# Patient Record
Sex: Female | Born: 2000 | Hispanic: Yes | Marital: Single
Health system: Midwestern US, Academic
[De-identification: ages and names within clinical notes are randomized; demographics above are authoritative.]

---

## 2022-02-16 ENCOUNTER — Encounter: Admit: 2022-02-16 | Discharge: 2022-02-16 | Payer: BC Managed Care – PPO

## 2022-02-16 ENCOUNTER — Ambulatory Visit: Admit: 2022-02-16 | Discharge: 2022-02-17 | Payer: BC Managed Care – PPO

## 2022-02-16 DIAGNOSIS — A749 Chlamydial infection, unspecified: Secondary | ICD-10-CM

## 2022-02-16 DIAGNOSIS — Z3402 Encounter for supervision of normal first pregnancy, second trimester: Secondary | ICD-10-CM

## 2022-02-16 NOTE — Progress Notes
Records request for all prenatal records faxed to [facility] at [phone]. Confirmation of successful transmission received

## 2022-02-16 NOTE — Telephone Encounter
Patient is 21 y.o. G1P0 [redacted]w[redacted]d by No LMP recorded. Patient is pregnant.  Pregnancy confirmed by ultrasound. Alison Miles     IPV date: 03/16/22  GA at IPV: 22wk6d  Request for earlier appointment: yes placed on high priority waiting list. Patient does desire CNM care but this is first available IPV with any provider.     21 y.o. G1P0 at [redacted]w[redacted]d gestational age not established patient with [facility]. Tx of care from Alison Miles (records requested). She feels her care will be better at [facility]. Denies significant health history. Will accept blood products. Does have cats at home but her sister changes the litter. Patient is taking prenatal vitamin. Anatomy ultrasound ordered.             OB History   Gravida Para Term Preterm AB Living   1             SAB IAB Ectopic Multiple Live Births                  # Outcome Date GA Lbr Len/2nd Weight Sex Delivery Anes PTL Lv   1 Current                Significant for G1P0    Medical History:   Diagnosis Date   ? Chlamydia      History reviewed. No pertinent surgical history.  History reviewed. No pertinent family history.  Social History     Socioeconomic History   ? Marital status: Single   Tobacco Use   ? Smoking status: Never   ? Smokeless tobacco: Never   Substance and Sexual Activity   ? Alcohol use: Not Currently   ? Drug use: Not Currently     Types: Marijuana                      Significant for denies known genetic or infectious disease history for her or her partner     Social History     Tobacco Use   Smoking Status Never   Smokeless Tobacco Never     Social History     Substance and Sexual Activity   Alcohol Use Not Currently     Social History     Substance and Sexual Activity   Drug Use Not Currently   ? Types: Marijuana         Current Outpatient Medications:   ?  prenatal vit no.124/iron/folic (PRENATAL VITAMIN PO), Take  by mouth., Disp: , Rfl:     Initial prenatal labs ordered No  NT ordered N/A  AFP No  Quad Screening No  Requested outside or operative records Yes requested from Alison Miles   [facility] Sent via MyChart: yes    Appointment scheduled with CNM Team  on 03/16/22.

## 2022-02-24 ENCOUNTER — Encounter: Admit: 2022-02-24 | Discharge: 2022-02-24 | Payer: BC Managed Care – PPO

## 2022-02-24 NOTE — Telephone Encounter
Received records for pt. Labeled and placed at front desk to be scanned to chart.

## 2022-02-25 ENCOUNTER — Encounter: Admit: 2022-02-25 | Discharge: 2022-02-25 | Payer: BC Managed Care – PPO

## 2022-02-25 ENCOUNTER — Ambulatory Visit: Admit: 2022-02-25 | Discharge: 2022-02-26 | Payer: BC Managed Care – PPO

## 2022-02-26 DIAGNOSIS — Z3402 Encounter for supervision of normal first pregnancy, second trimester: Secondary | ICD-10-CM

## 2022-03-16 ENCOUNTER — Encounter: Admit: 2022-03-16 | Discharge: 2022-03-16 | Payer: BC Managed Care – PPO

## 2022-03-16 ENCOUNTER — Ambulatory Visit: Admit: 2022-03-16 | Discharge: 2022-03-17 | Payer: BC Managed Care – PPO

## 2022-03-16 VITALS — Temp 97.90000°F | Ht 61.0 in | Wt 132.4 lb

## 2022-03-16 DIAGNOSIS — A749 Chlamydial infection, unspecified: Secondary | ICD-10-CM

## 2022-03-16 DIAGNOSIS — Z3492 Encounter for supervision of normal pregnancy, unspecified, second trimester: Secondary | ICD-10-CM

## 2022-03-16 NOTE — Progress Notes
OB History and Physical      HPI:     Alison Miles is a 21 y.o. G1P0 @ [redacted]w[redacted]d by L=17, Estimated Date of Delivery: 07/13/22.  She is being seen today for her first obstetrical visit.  Her last period was normal.  This is not a planned pregnancy.  Pt is transferring care from [facility].  Pt has a hx of Chlamydia this pregnancy, treated with a  Negative TOC.  Pt also has a treated UTI this pregnancy, will obtain TOC today.  Denies any other pertinent medical hx.  Care UTD per pt.      FOB:  Caryn Bee  Patient does intend to breast-feed.     +FM, -VB, -LOF, -CTX    Review of Systems:     All other systems reviewed and are negative. Denies HA, fevers, chills, nausea, vomiting, CP, SOA, abdominal pain, urinary problems or dysuria, diarrhea, constipation, edema.    Obstetrical History:     OB History   Gravida Para Term Preterm AB Living   1 0 0 0 0 0   SAB IAB Ectopic Multiple Live Births   0 0 0 0 0      # Outcome Date GA Lbr Len/2nd Weight Sex Delivery Anes PTL Lv   1 Current                              Gynecologic History:     Pap:  N/a, age age 44  STD:  Positive for Chlamydia, treated, TOC negative (in scanned in records)  Menstrual History:  Menstrual History   ? Age at menarche 78    ? Duration of menses 6    ? Length of Cycle 28    ? Menopause?     ? Age at menopause          Medical History:     Medical History:   Diagnosis Date   ? Chlamydia        No DM, HTN, HLD, CAD, thyroid disease, anxiety, depression, bleeding/clotting disorders, blood transfusions, seizures, IBS, endometriosis.    Surgical History:     History reviewed. No pertinent surgical history.      Family History:     History reviewed. No pertinent family history.    Denies family h/o DM, asthma, HTN, CA, seizure d/o, bleeding/clotting d/o, depression, birth defects.    Social History:        Social History     Socioeconomic History   ? Marital status: Single   Tobacco Use   ? Smoking status: Never   ? Smokeless tobacco: Never   Substance and Sexual Activity   ? Alcohol use: Not Currently   ? Drug use: Not Currently     Types: Marijuana   ? Sexual activity: Yes     Partners: Male       Denies tobacco, ETOH, drug use.    Allergies:     No Known Allergies      Current Medications:     Outpatient Encounter Medications as of 03/16/2022   Medication Sig Dispense Refill   ? prenatal vit no.124/iron/folic (PRENATAL VITAMIN PO) Take  by mouth.       No facility-administered encounter medications on file as of 03/16/2022.         Exam:     Vitals:    03/16/22 0841   Temp: 36.6 ?C (97.9 ?F)   TempSrc: Oral  PainSc: Zero   Weight: 60.1 kg (132 lb 6.4 oz)   Height: 154.9 cm (5' 1)   Body mass index is 25.02 kg/m?Marland Kitchen      Physical Exam   Constitutional: She is oriented to person, place, and time. She appears well-developed and well-nourished, no apparent distress  Head: Normocephalic.   Neck: Normal range of motion.   Cardiovascular: Regular rate and rhythm  Pulmonary/Chest: Effort normal, clear to auscultation bilaterally, equal expansion bilaterally  Abdomen:  Not tender to palpation, gravid  Musculoskeletal: Normal range of motion.   Extremities:  No lower extremity edema, nttp  Neurological: She is alert and oriented to person, place, and time.   Psychiatric: She has a normal mood and affect.       SSE: deferred  SVE: deferred  FHR: 140    CAFC sono:   17 wk sono at [facility]-scanned into chart    DUS @ [redacted]w[redacted]d- EFW 36%, AC 42%, AFI wnl, anterior placenta.  Small VSD noted 1.1 mm.  Fetal echo suggested at 23-[redacted] weeks gestation.    Prenatal Labs (records scanned into chart):  Blood type B+  Ab neg  Rubella Immune  HepB NR  HepC NR  Syph B Neg  GC/CT neg/positive (01/06/22), TOC negative 01/2022)-scanned in  HIV eng    Assessment:     21 y.o. G1P0 @ [redacted]w[redacted]d here for initial prenatal visit.         1. Encounter for pregnancy related examination, antepartum  CULTURE-URINE W/SENSITIVITY    CULTURE-URINE W/SENSITIVITY      2. Second trimester pregnancy        3. Chlamydia in pregnancy, treated, TOC negative  4.  + UTI, treated, TOC today  5.  Teen pregnancy    Plan:       1. Initial labs reviered.  2. UTI TOC sent today  3. Prenatal vitamins daily  4. NIPS and Carrier screen not done  5. NT-not done  6. msAFP -negative  7. DUS @ [redacted]w[redacted]d- EFW 36%, AC 42%, AFI wnl, anterior placenta. Small VSD noted 1.1 mm. Fetal echo suggested at 23-[redacted] weeks gestation.  8. 3T labs and TDAP at 28 weeks  9. GBS at 36 weeks  10. Pt may take Vitamin B6 25 mg up to 3 times a day and adding Unisom at night  11. Discussed food safety, including avoiding high risk foods (undercooked meat, high mercury seafood, unpasteurized cheeses), eating a serving of fish weekly, and avoiding cat litter.   12. Discontinue NSAIDs in pregnancy  13. Problem list reviewed and updated  14. Flu shot discussed - encouraged during season  66. Discussed COVID precautions; COVID Vaccine X2-offer booster next visit  16. Handout given regarding benefits of breastfeeding. Will discuss in greater detail 2nd and 3rd trimester  17. Discussed the plan for Midwifery care at [place], including Physician consult, co-management, and transfer of care if necessary  18. Discussed breastfeeding in pregnancy - written information provided, will continue to discuss at future visits  19. Next appointment in 4 weeks      Janeece Agee, APRN, CNM  Collaborating Physician: Dr. Betti Cruz

## 2022-03-16 NOTE — Patient Instructions
General Instructions  For prompt and efficient communication, MyChart is preferred. Please sign up.  [email or web address]      To Contact Me:   Please send a MyChart message to your doctor or call [phone] to reach your doctor's nurse team. Please only call once in a 24-hour period. Leave a voicemail for the nurse to respond.  Calls or messages received after 4pm will be returned the next business day.  To Have Medication Refilled:   Please use the MyChart Refill request or contact your pharmacy directly to request medication refills. Please allow 72 hours.  To Receive Your Test Results:  Please allow 2 business day for labs to result in MyChart.  Please allow 4 business days for other tests, including cardiac studies, blood bank, and radiology results.  It can take up to 10 days for pathology  Our Lab (Location, Hours, and Fasting Information)  Lab Location: the 1st floor of the Medical Office Building, directly to the left of the Information Desk.  Lab Hours: Monday 6:30 am-6 pm, Tuesday-Friday 7 am-6 pm, and Saturday 7 am-noon.   Fasting for Lab: For fasting labs, please:   Do not eat for at least 8-10 hours before having your labs drawn, drink plenty of water, and make sure to continue your medications as prescribed (unless otherwise specified).  Radiology is on the 2nd floor of the Medical Office Building. Please call [phone], option #1; Mammogram at [facility] location, please call [phone], option #1.  To Schedule an Appointment:   Contact our schedulers at [phone] and select #1 to make an appointment. At this time, you will be encouraged to signed up to MyChart if you not active.     Communication preferences can be managed in MyChart to ensure you receive important appointment notifications    For Urgent Questions:   For medical emergencies, call 911.  On nights, weekends or holidays, call the Hospital operator at [phone], and ask for the ?Ob/Gyn Physician on call or Certified Nurse Midwife on call.?        We value your feedback and you may receive a survey about your experience with our office. If you had a favorable experience, the only positive survey results that we will receive are if we are rated in the 9 or 10 range out of 10 points. Please let us know why we did not earn 9-10 rating.  Thank you.

## 2022-03-16 NOTE — Telephone Encounter
RN called pt- name & DOB verified. Advised provider is out sick today and RN inquired if patient is on her way. Patient states is currently headed to the clinic. Advised will still see patient this morning if arrives by appointment time. Pt v/u & denies further questions.

## 2022-03-17 DIAGNOSIS — Z3490 Encounter for supervision of normal pregnancy, unspecified, unspecified trimester: Principal | ICD-10-CM

## 2022-03-24 ENCOUNTER — Ambulatory Visit: Admit: 2022-03-24 | Discharge: 2022-03-25 | Payer: BC Managed Care – PPO

## 2022-03-24 ENCOUNTER — Encounter: Admit: 2022-03-24 | Discharge: 2022-03-24 | Payer: BC Managed Care – PPO

## 2022-03-24 DIAGNOSIS — O0932 Supervision of pregnancy with insufficient antenatal care, second trimester: Secondary | ICD-10-CM

## 2022-03-27 ENCOUNTER — Encounter: Admit: 2022-03-27 | Discharge: 2022-03-27 | Payer: BC Managed Care – PPO

## 2022-04-13 NOTE — Progress Notes
Routine Prenatal Visit    S: 21 y.o. G1P0 @ [redacted]w[redacted]d by L=17. Estimated Date of Delivery: 07/13/22 here for routine PNC. Pt reports + FM, denies LOF/VB/CTX. Reviewed fetal kick counts and pregnancy precautions. No concerns.     O: See vitals and notes in OB tools tab.    A/P:  Prenatal Labs (records scanned into chart):  Blood type B+  Ab neg  Rubella Immune  HepB NR  HepC NR  Syph B Neg  GC/CT neg/positive (01/06/22), TOC negative 01/2022)-scanned in  HIV NR    Transfer of care from [facility] Cory  History of chlamydia this pregnancy, TOC negative  Teen pregnancy  UTI this pregnancy, TOC negative    Birth plan: Birth plan given to the patient today, will discuss next visit  BCM Plan: Discussed contraception, will consider options. To be discussed next appointment.   Plans to breast feed  GBS @ 36 weeks    Continue Routine Prenatal Care  Prenatal vitamins daily  NT not completed; NIPS not completed, Carrier Screen not completed; msAFP not completed  DUS @ [redacted]w[redacted]d- EFW 36%, AC 42%, AFI wnl, anterior placenta. Small VSD noted 1.1 mm. Fetal echo suggested at 23-[redacted] weeks gestation.  3T labs to be completed next week    Precautions given, [facility] instructions given  tdap vaccine today  s/p COVID vaccine X 2, declined booster in pregnancy  FU 4 weeks  CNM patient    Future Appointments   Date Time Provider Department Center   04/16/2022  8:30 AM Maia Breslow, APRN [facility] OB[facility]GYN   04/21/2022  9:30 AM OBGYN [facility] RM 5 [facility]   05/12/2022  8:15 AM Cicchetto, Evalee Mutton, APRN [facility]GY[facility]         Danijah Noh L. Maisie Fus, APRN, CNM  Collaborating Physician:  Dr. Theresia Majors

## 2022-04-16 ENCOUNTER — Ambulatory Visit: Admit: 2022-04-16 | Discharge: 2022-04-17 | Payer: BC Managed Care – PPO

## 2022-04-16 ENCOUNTER — Encounter: Admit: 2022-04-16 | Discharge: 2022-04-16 | Payer: BC Managed Care – PPO

## 2022-04-16 DIAGNOSIS — A749 Chlamydial infection, unspecified: Secondary | ICD-10-CM

## 2022-04-16 DIAGNOSIS — Z3490 Encounter for supervision of normal pregnancy, unspecified, unspecified trimester: Secondary | ICD-10-CM

## 2022-04-16 MED ORDER — DIPHTH,PERTUS(ACELL),TETANUS 2.5-8-5 LF-MCG-LF/0.5ML IM SYRG
.5 mL | Freq: Once | INTRAMUSCULAR | 0 refills | Status: CP
Start: 2022-04-16 — End: ?
  Administered 2022-04-16: 14:00:00 0.5 mL via INTRAMUSCULAR

## 2022-04-16 NOTE — Patient Instructions
General Instructions  For prompt and efficient communication, MyChart is preferred. Please sign up.  [email or web address]      To Contact Me:   Please send a MyChart message to your doctor or call [phone] to reach your doctor's nurse team. Please only call once in a 24-hour period. Leave a voicemail for the nurse to respond.  Calls or messages received after 4pm will be returned the next business day.  To Have Medication Refilled:   Please use the MyChart Refill request or contact your pharmacy directly to request medication refills. Please allow 72 hours.  To Receive Your Test Results:  Please allow 2 business day for labs to result in MyChart.  Please allow 4 business days for other tests, including cardiac studies, blood bank, and radiology results.  It can take up to 10 days for pathology  Our Lab (Location, Hours, and Fasting Information)  Lab Location: the 1st floor of the Medical Office Building, directly to the left of the Information Desk.  Lab Hours: Monday 6:30 am-6 pm, Tuesday-Friday 7 am-6 pm, and Saturday 7 am-noon.   Fasting for Lab: For fasting labs, please:   Do not eat for at least 8-10 hours before having your labs drawn, drink plenty of water, and make sure to continue your medications as prescribed (unless otherwise specified).  Radiology is on the 2nd floor of the Medical Office Building. Please call [phone], option #1; Mammogram at [facility] location, please call [phone], option #1.  To Schedule an Appointment:   Contact our schedulers at [phone] and select #1 to make an appointment. At this time, you will be encouraged to signed up to MyChart if you not active.     Communication preferences can be managed in MyChart to ensure you receive important appointment notifications    For Urgent Questions:   For medical emergencies, call 911.  On nights, weekends or holidays, call the Hospital operator at [phone], and ask for the ?Ob/Gyn Physician on call or Certified Nurse Midwife on call.?        We value your feedback and you may receive a survey about your experience with our office. If you had a favorable experience, the only positive survey results that we will receive are if we are rated in the 9 or 10 range out of 10 points. Please let us know why we did not earn 9-10 rating.  Thank you.

## 2022-04-17 DIAGNOSIS — Z3A27 27 weeks gestation of pregnancy: Secondary | ICD-10-CM

## 2022-04-21 ENCOUNTER — Encounter: Admit: 2022-04-21 | Discharge: 2022-04-21 | Payer: BC Managed Care – PPO

## 2022-04-21 ENCOUNTER — Ambulatory Visit: Admit: 2022-04-21 | Discharge: 2022-04-21 | Payer: BC Managed Care – PPO

## 2022-04-21 DIAGNOSIS — Z3490 Encounter for supervision of normal pregnancy, unspecified, unspecified trimester: Secondary | ICD-10-CM

## 2022-04-27 ENCOUNTER — Ambulatory Visit: Admit: 2022-04-27 | Discharge: 2022-04-27 | Payer: BC Managed Care – PPO

## 2022-04-27 ENCOUNTER — Encounter: Admit: 2022-04-27 | Discharge: 2022-04-27 | Payer: BC Managed Care – PPO

## 2022-04-27 DIAGNOSIS — Z3A27 27 weeks gestation of pregnancy: Secondary | ICD-10-CM

## 2022-04-27 LAB — CBC
HEMATOCRIT: 29 % — ABNORMAL LOW (ref 36–45)
MCH: 31 pg (ref 26–34)
MCHC: 34 g/dL (ref 32.0–36.0)
MCV: 91 FL (ref 80–100)
MPV: 9.8 FL (ref 7–11)
PLATELET COUNT: 220 K/UL (ref 150–400)
RBC COUNT: 3.2 M/UL — ABNORMAL LOW (ref 4.0–5.0)
RDW: 13 % (ref 11–15)
WBC COUNT: 10 K/UL (ref 4.5–11.0)

## 2022-04-27 LAB — HIV 1 & 2 AG-AB SCRN W REFLEX TO HIV CONFIRMATION

## 2022-04-27 LAB — GLUCOSE TOL-1 HR: GLUCOSE, 1 HR.: 99 mg/dL

## 2022-04-27 LAB — SYPHILIS AB SCREEN: SYPHILIS AB, TOTAL: NEGATIVE

## 2022-05-12 ENCOUNTER — Ambulatory Visit: Admit: 2022-05-12 | Discharge: 2022-05-13 | Payer: BC Managed Care – PPO

## 2022-05-12 ENCOUNTER — Encounter: Admit: 2022-05-12 | Discharge: 2022-05-12 | Payer: BC Managed Care – PPO

## 2022-05-12 DIAGNOSIS — A749 Chlamydial infection, unspecified: Secondary | ICD-10-CM

## 2022-05-12 DIAGNOSIS — Z3A31 31 weeks gestation of pregnancy: Secondary | ICD-10-CM

## 2022-05-12 DIAGNOSIS — Z3403 Encounter for supervision of normal first pregnancy, third trimester: Secondary | ICD-10-CM

## 2022-05-12 NOTE — Progress Notes
Routine Prenatal Visit    S: 21 y.o. G1P0 @ [redacted]w[redacted]d by L=17. Estimated Date of Delivery: 07/13/22 here for routine PNC. Pt reports + FM, denies LOF/VB/CTX. Reviewed birth plan and routine prenatal care and GBS Collection at 36 weeks .     O: See vitals and notes in OB tools tab.    A/P:  Prenatal Labs (records scanned into chart):  Blood type B+  Ab neg  Rubella Immune  HepB NR  HepC NR  Syph B Neg  GC/CT neg/positive (01/06/22), TOC negative 01/2022)-scanned in  HIV NR    Pt desires CNM care- discussed ongoing changes with [facility], unlikely that she will have a midwife for delivery here. Discussed we are a teaching hospital and resident care overseen by attendings are standard practice for our OBGYN team here. Pt accepting of this model of care.     Anemia- starting oral iron   Transfer of care from [facility] Boston  History of chlamydia this pregnancy, TOC negative  Teen pregnancy- Discuss social work consult at next apt   UTI this pregnancy, TOC negative    Birth plan: Birth plan collected and reviewed today   BCM Plan: Declines   Plans to breast feed  GBS @ 36 weeks    Continue Routine Prenatal Care  Prenatal vitamins daily  NT not completed; NIPS not completed, Carrier Screen not completed; msAFP not completed  DUS @ [redacted]w[redacted]d- EFW 36%, AC 42%, AFI wnl, anterior placenta. Small VSD noted 1.1 mm. Fetal echo suggested at 23-[redacted] weeks gestation.  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 Ernesta Amble

## 2022-05-12 NOTE — Patient Instructions
For up to date information on the COVID-19 virus, visit the [facility] website. [email or web address]   General supportive care during cold and flu season and infection prevention reminders:    o Wash hands often with soap and water for at least 20 seconds   o Cover your mouth and nose   o Social distancing: try to maintain 6 feet between you and other people   o Stay home if sick and symptoms mild or manageable?  If you must be around people wear a mask     If you are having symptoms of a lower respiratory infection (cough, shortness of breath) and/or fever AND either traveled in last 30 days (internationally or to region of exposure) OR known exposure to patient with COVID19:     o Call your primary care provider for questions or health needs.   Tell your doctor about your recent travel and your symptoms     o In a medical emergency, call 911 or go to the nearest emergency room.  General Instructions  For prompt and efficient communication, MyChart is preferred. Please sign up.  [email or web address]      To Contact Me:   Please send a MyChart message to your doctor or call [phone] to reach your doctor's nurse team. Please only call once in a 24-hour period. Leave a voicemail for the nurse to respond.  Calls or messages received after 4pm will be returned the next business day.  To Have Medication Refilled:   Please use the MyChart Refill request or contact your pharmacy directly to request medication refills. Please allow 72 hours.  To Receive Your Test Results:  Please allow 2 business day for labs to result in MyChart.  Please allow 4 business days for other tests, including cardiac studies, blood bank, and radiology results.  It can take up to 10 days for pathology  Our Lab (Location, Hours, and Fasting Information)  Lab Location: the 1st floor of the Medical Office Building, directly to the left of the Information Desk.  Lab Hours: Monday 6:30 am-6 pm, Tuesday-Friday 7 am-6 pm, and Saturday 7 am-noon.   Fasting for Lab: For fasting labs, please:   Do not eat for at least 8-10 hours before having your labs drawn, drink plenty of water, and make sure to continue your medications as prescribed (unless otherwise specified).  Radiology is on the 2nd floor of the Medical Office Building. Please call [phone], option #1; Mammogram at [facility] location, please call [phone], option #1.  To Schedule an Appointment:   Contact our schedulers at [phone] and select #1 to make an appointment. At this time, you will be encouraged to signed up to MyChart if you not active.     Communication preferences can be managed in MyChart to ensure you receive important appointment notifications    For Urgent Questions:   For medical emergencies, call 911.  On nights, weekends or holidays, call the Hospital operator at [phone], and ask for the ?Ob/Gyn Physician on call or Certified Nurse Midwife on call.?        We value your feedback and you may receive a survey about your experience with our office. If you had a favorable experience, the only positive survey results that we will receive are if we are rated in the 9 or 10 range out of 10 points. Please let us know why we did not earn 9-10 rating.  Thank you.

## 2022-05-25 ENCOUNTER — Encounter: Admit: 2022-05-25 | Discharge: 2022-05-25 | Payer: BC Managed Care – PPO

## 2022-05-27 ENCOUNTER — Encounter: Admit: 2022-05-27 | Discharge: 2022-05-27 | Payer: BC Managed Care – PPO

## 2022-05-27 NOTE — Progress Notes
Successfully faxed back ADA medical assessment form to [employer]. Confirmation received. Placed in file to be scanned into patient chart.

## 2022-05-29 ENCOUNTER — Encounter: Admit: 2022-05-29 | Discharge: 2022-05-29 | Payer: BC Managed Care – PPO

## 2022-06-08 ENCOUNTER — Encounter: Admit: 2022-06-08 | Discharge: 2022-06-08 | Payer: BC Managed Care – PPO

## 2022-06-08 NOTE — Telephone Encounter
RN attempted to call patient to reschedule 10/30 appointment as provider will be out. No answer. Unable to leave vmx as vmx box is full. [facility] sent for notification.

## 2022-06-17 ENCOUNTER — Ambulatory Visit: Admit: 2022-06-17 | Discharge: 2022-06-18 | Payer: BC Managed Care – PPO

## 2022-06-17 ENCOUNTER — Encounter: Admit: 2022-06-17 | Discharge: 2022-06-17 | Payer: BC Managed Care – PPO

## 2022-06-17 DIAGNOSIS — Z3A36 36 weeks gestation of pregnancy: Secondary | ICD-10-CM

## 2022-06-17 DIAGNOSIS — A749 Chlamydial infection, unspecified: Secondary | ICD-10-CM

## 2022-06-17 DIAGNOSIS — Z3403 Encounter for supervision of normal first pregnancy, third trimester: Secondary | ICD-10-CM

## 2022-06-17 NOTE — Progress Notes
Routine Prenatal Visit    S: 21 y.o. G1P0 @ [redacted]w[redacted]d by L=17. Estimated Date of Delivery: 07/13/22 here for routine PNC. Pt reports + FM, denies LOF/VB/CTX. Answered questions re [place] vs labor contractions. Pt declines SWC.     O: See vitals and notes in OB tools tab.    A/P:  Prenatal Labs (records scanned into chart):  Blood type B+  Ab neg  Rubella Immune  HepB NR  HepC NR  Syph B Neg  GC/CT neg/positive (01/06/22), TOC negative 01/2022)-scanned in  HIV NR    Pt desires CNM care- discussed ongoing changes with [facility], unlikely that she will have a midwife for delivery here. Discussed we are a teaching hospital and resident care overseen by attendings are standard practice for our OBGYN team here. Pt accepting of this model of care.     Anemia- starting oral iron   Transfer of care from Del[facility] Boston  History of chlamydia this pregnancy, TOC negative  Teen pregnancy- declines SWC  UTI this pregnancy, TOC negative    Birth plan: scanned into chart; voices to be the first thing baby hears, unsure re epidural.   BCM Plan: Declines   Plans to breast feed- assess if has breast pump next visit   GBS self swab done today     NT not completed; NIPS not completed, Carrier Screen not completed; msAFP not completed  DUS @ [redacted]w[redacted]d- EFW 36%, AC 42%, AFI wnl, anterior placenta. Small VSD noted 1.1 mm. Fetal echo suggested at 23-[redacted] weeks gestation.  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 Pearlean Brownie, APRN, CNM  Collaborating Physician: Dr. Annell Greening

## 2022-06-18 ENCOUNTER — Encounter: Admit: 2022-06-18 | Discharge: 2022-06-18 | Payer: BC Managed Care – PPO

## 2022-06-23 ENCOUNTER — Encounter: Admit: 2022-06-23 | Discharge: 2022-06-23 | Payer: BC Managed Care – PPO

## 2022-06-23 ENCOUNTER — Ambulatory Visit: Admit: 2022-06-23 | Discharge: 2022-06-24 | Payer: BC Managed Care – PPO

## 2022-06-23 DIAGNOSIS — A749 Chlamydial infection, unspecified: Secondary | ICD-10-CM

## 2022-06-23 DIAGNOSIS — Z3A37 37 weeks gestation of pregnancy: Secondary | ICD-10-CM

## 2022-06-23 DIAGNOSIS — Z3403 Encounter for supervision of normal first pregnancy, third trimester: Secondary | ICD-10-CM

## 2022-06-23 NOTE — Progress Notes
Routine Prenatal Visit    S: 21 y.o. G1P0 @ [redacted]w[redacted]d by L=17. Estimated Date of Delivery: 07/13/22 here for routine PNC. Pt reports + FM, denies LOF/VB/CTX. Denies question or concerns. Would like a cervical exam today for her own curiosity.     O: See vitals and notes in OB tools tab.  SVE- 0/25/-3 soft    A/P:  Prenatal Labs (records scanned into chart):  Blood type B+  Ab neg  Rubella Immune  HepB NR  HepC NR  Syph B Neg  GC/CT neg/positive (01/06/22), TOC negative 01/2022)-scanned in  HIV NR    Pt desires CNM care- discussed ongoing changes with [facility], unlikely that she will have a midwife for delivery here. Discussed we are a teaching hospital and resident care overseen by attendings are standard practice for our OBGYN team here. Pt accepting of this model of care.     Anemia- starting oral iron   Transfer of care from [facility] Cory  History of chlamydia this pregnancy, TOC negative  Teen pregnancy- declines SWC  UTI this pregnancy, TOC negative    Birth plan: scanned into chart; voices to be the first thing baby hears, unsure re epidural.   BCM Plan: Declines   Plans to breast feed- has pump ordered   GBS - neg    NT not completed; NIPS not completed, Carrier Screen not completed; msAFP not completed  DUS @ [redacted]w[redacted]d- EFW 36%, AC 42%, AFI wnl, anterior placenta. Small VSD noted 1.1 mm. Fetal echo suggested at 23-[redacted] weeks gestation.  3T labs WNL except anemia   tdap vaccine- 04/16/22  s/p COVID vaccine X 2, declined booster in pregnancy  Flu- 05/29/22      Pregnancy and fetal movement precautions given   FU weekly until delivery   CNM patient      [facility], APRN, [place]  Collaborating Physician: Dr. Starleen Arms

## 2022-07-01 ENCOUNTER — Ambulatory Visit: Admit: 2022-07-01 | Discharge: 2022-07-02 | Payer: BC Managed Care – PPO

## 2022-07-01 ENCOUNTER — Encounter: Admit: 2022-07-01 | Discharge: 2022-07-01 | Payer: BC Managed Care – PPO

## 2022-07-01 DIAGNOSIS — Z113 Encounter for screening for infections with a predominantly sexual mode of transmission: Secondary | ICD-10-CM

## 2022-07-01 DIAGNOSIS — A749 Chlamydial infection, unspecified: Secondary | ICD-10-CM

## 2022-07-01 LAB — CHLAM/NG PCR SWAB
CHLAMYDIA PROBE: NEGATIVE
GONORRHEA PROBE: NEGATIVE

## 2022-07-01 NOTE — Progress Notes
21 y.o. G1P0 at [redacted]w[redacted]d gestational age, Estimated Date of Delivery: 07/13/22    CNM pt  +FM, -LOF, -VB, -CTX      Routine Prenatal Visit    O: See vitals and notes in OB tools tab.    Feeling well, just uncomfortable.   Does not desire 39w IOL at this time.    A/P:  Prenatal Labs (records scanned into chart):  Blood type B+  Ab neg  Rubella Immune  HepB NR  HepC NR  Syph B Neg  GC/CT neg/positive (01/06/22), TOC negative 01/2022)-scanned in. 3T gc/ct retest today.  HIV NR    Pt desires CNM care- discussed ongoing changes with [facility], unlikely that she will have a midwife for delivery here. Discussed we are a teaching hospital and resident care overseen by attendings are standard practice for our OBGYN team here. Pt accepting of this model of care.     Anemia- starting oral iron   Transfer of care from [facility] Cory  History of chlamydia this pregnancy, TOC negative.   Teen pregnancy- declines SWC  UTI this pregnancy, TOC negative    Birth plan: scanned into chart; voices to be the first thing baby hears, unsure re epidural.   BCM Plan: Declines   Plans to breast feed- has pump ordered   GBS - neg    NT not completed; NIPS not completed, Carrier Screen not completed; msAFP not completed  DUS @ [redacted]w[redacted]d- EFW 36%, AC 42%, AFI wnl, anterior placenta. Small VSD noted 1.1 mm. Fetal echo suggested at 23-[redacted] weeks gestation.  3T labs WNL except anemia   tdap vaccine- 04/16/22  s/p COVID vaccine X 2, declined booster in pregnancy  Flu- 05/29/22    RTC weekly    Lab Results   Component Value Date    ABSCRN NEG 04/27/2022    HIV12AGABSCN NONREACTIVE 04/27/2022    SYPHILISABTO NEG 04/27/2022      Results for orders placed or performed in visit on 06/17/22   CULTURE-STREP SCREEN    Specimen: Rectal Vaginal; Swab   Result Value Ref Range    Battery Name STREP SCREEN CULTURE     Report Status FINAL 06/19/2022     Specimen Description SWAB RECTAL/VAGINAL     Special Requests No special requests     Culture NEGATIVE FOR GROUP B STREPTOCOCCUS        Future Appointments   Date Time Provider Department Center   07/07/2022  8:30 AM Cicchetto, Evalee Mutton, APRN [facility] OB[facility]GYN   07/15/2022 11:00 AM [facility]BGYN [facility]          Beecher Mcardle, APRN-NP  Collaborating physician Dr. Starleen Arms

## 2022-07-07 ENCOUNTER — Encounter: Admit: 2022-07-07 | Discharge: 2022-07-07 | Payer: BC Managed Care – PPO

## 2022-07-07 ENCOUNTER — Ambulatory Visit: Admit: 2022-07-07 | Discharge: 2022-07-08 | Payer: BC Managed Care – PPO

## 2022-07-07 DIAGNOSIS — A749 Chlamydial infection, unspecified: Secondary | ICD-10-CM

## 2022-07-07 DIAGNOSIS — Z3A39 39 weeks gestation of pregnancy: Secondary | ICD-10-CM

## 2022-07-07 NOTE — Patient Instructions
To Contact Me:  Please send a MyChart message to your doctor or call [phone] to reach your doctor's nurse team. Please only call once in a 24-hour period. Leave a voicemail for the nurse to respond.  Calls or messages received after 4pm will be returned the next business day.  To Have Medication Refilled:  Please use the MyChart Refill request or contact your pharmacy directly to request medication refills. Please allow 72 hours.    To Receive Your Test Results:  Please allow 2 business day for labs to result in MyChart.  Please allow 4 business days for other tests, including cardiac studies, blood bank, and radiology results.  It can take up to 10 days for pathology  Our Lab (Location, Hours, and Fasting Information)  Lab Location: the 1st floor of the Medical Office Building, directly to the left of the Information Desk.  Lab Hours: Monday 6:30 am-6 pm, Tuesday-Friday 7 am-6 pm, and Saturday 7 am-noon.  Fasting for Lab: For fasting labs, please:  Do not eat for at least 8-10 hours before having your labs drawn, drink plenty of water, and make sure to continue your medications as prescribed (unless otherwise specified).  Radiology is on the 2nd floor of the Medical Office Building. Please call [phone], option #1; Mammogram at [place] location, please call [phone], option #1.    To Schedule an Appointment:  Contact our schedulers at [phone] and select #1 to make an appointment. At this time, you will be encouraged to signed up to MyChart if you not active.    To Receive Appointment Reminders on Your Cell Phone:  Communication preferences can be managed in MyChart to ensure you receive important appointment notifications    For Urgent Questions:  For medical emergencies, call 911.  On nights, weekends or holidays, call the Hospital operator at [phone], and ask for the ?Ob/Gyn Physician on call or Certified Nurse Midwife on call.?      We value your feedback and you may receive a survey about your experience with our office. If you had a favorable experience, the only positive survey results that we will receive are if we are rated in the 9 or 10 range out of 10 points. Please let us know why we did not earn 9-10 rating.  Thank you

## 2022-07-15 ENCOUNTER — Ambulatory Visit: Admit: 2022-07-15 | Discharge: 2022-07-16 | Payer: BC Managed Care – PPO

## 2022-07-15 ENCOUNTER — Encounter: Admit: 2022-07-15 | Discharge: 2022-07-15 | Payer: BC Managed Care – PPO

## 2022-07-15 DIAGNOSIS — Z3403 Encounter for supervision of normal first pregnancy, third trimester: Secondary | ICD-10-CM

## 2022-07-15 DIAGNOSIS — A749 Chlamydial infection, unspecified: Secondary | ICD-10-CM

## 2022-07-15 NOTE — Patient Instructions
For up to date information on the COVID-19 virus, visit the CDC website. https://www.cdc.gov/coronavirus   General supportive care during cold and flu season and infection prevention reminders:    o Wash hands often with soap and water for at least 20 seconds   o Cover your mouth and nose   o Social distancing: try to maintain 6 feet between you and other people   o Stay home if sick and symptoms mild or manageable?  If you must be around people wear a mask     If you are having symptoms of a lower respiratory infection (cough, shortness of breath) and/or fever AND either traveled in last 30 days (internationally or to region of exposure) OR known exposure to patient with COVID19:     o Call your primary care provider for questions or health needs.   Tell your doctor about your recent travel and your symptoms     o In a medical emergency, call 911 or go to the nearest emergency room.

## 2022-07-15 NOTE — Progress Notes
CNM pt   +FM, -LOF, -VB, -CTX    Routine Prenatal Visit  21 y.o. G1P0 @ [redacted]w[redacted]d by L=17. Estimated Date of Delivery: 07/13/22 here for routine PNC.    Feeling well  Desires spontaneous labor   US/ BPP ordered for postdates     O: See vitals and notes in OB tools tab.      A/P:  Prenatal Labs (records scanned into chart):  Blood type B+  Ab neg  Rubella Immune  HepB NR  HepC NR  Syph B Neg  GC/CT neg/positive (01/06/22), TOC negative 01/2022)-scanned in.  HIV NR      Lab Results   Component Value Date    ABSCRN NEG 04/27/2022    HIV12AGABSCN NONREACTIVE 04/27/2022    SYPHILISABTO NEG 04/27/2022    CHLAMYDIA NEG 07/01/2022    GONORRHEA NEG 07/01/2022          Anemia- taking oral iron   Transfer of care from Meyer Cory  History of chlamydia this pregnancy, TOC negative.   Teen pregnancy- declines SWC  UTI this pregnancy, TOC negative    Birth plan: scanned into chart; voices to be the first thing baby hears, unsure re epidural. Declines IOL.   BCM Plan: Declines   Plans to breast feed- has pump ordered   GBS - neg, g/c in 3rd tri neg     Pt desires CNM care- discussed ongoing changes with [facility], unlikely that she will have a midwife for delivery here. Discussed we are a teaching hospital and resident care overseen by attendings are standard practice for our OBGYN team here. Pt accepting of this model of care.     NT not completed; NIPS not completed, Carrier Screen not completed; msAFP not completed  DUS @ [redacted]w[redacted]d- EFW 36%, AC 42%, AFI wnl, anterior placenta. Small VSD noted 1.1 mm. Fetal echo suggested at 23-[redacted] weeks gestation.   GS @ [redacted]w[redacted]d, EFW 26%ile, anterior placenta, cephalic, Repeat growth in 4 weeks for previously noted VSD.   GS @ [redacted]w[redacted]d- EFW 27%. AC 31%, AFI wnl, anterior placenta. Repeat ultrasound as clinically indicated.   3T labs WNL except anemia   tdap vaccine- 04/16/22  s/p COVID vaccine X 2, declined booster in pregnancy  Flu- 05/29/22    Labor and fetal movement precautions given   FU weekly until delivery     Results for orders placed or performed in visit on 06/17/22   CULTURE-STREP SCREEN    Specimen: Rectal Vaginal; Swab   Result Value Ref Range    Battery Name STREP SCREEN CULTURE     Report Status FINAL 06/19/2022     Specimen Description SWAB RECTAL/VAGINAL     Special Requests No special requests     Culture NEGATIVE FOR GROUP B STREPTOCOCCUS        No future appointments.  Beecher Mcardle, APRN-NP  Collaborating physician Dr. Starleen Arms

## 2022-07-20 ENCOUNTER — Inpatient Hospital Stay: Admit: 2022-07-20 | Discharge: 2022-07-20 | Payer: BC Managed Care – PPO

## 2022-07-20 ENCOUNTER — Encounter: Admit: 2022-07-20 | Discharge: 2022-07-20 | Payer: BC Managed Care – PPO

## 2022-07-20 ENCOUNTER — Inpatient Hospital Stay: Admit: 2022-07-20 | Payer: BC Managed Care – PPO

## 2022-07-20 DIAGNOSIS — O480 Post-term pregnancy: Secondary | ICD-10-CM

## 2022-07-20 DIAGNOSIS — A749 Chlamydial infection, unspecified: Secondary | ICD-10-CM

## 2022-07-20 LAB — DIRECT EXAM (WET PREP)

## 2022-07-20 LAB — CBC
HEMATOCRIT: 31 % — ABNORMAL LOW (ref 36–45)
HEMOGLOBIN: 10 g/dL — ABNORMAL LOW (ref 12.0–15.0)
MCH: 29 pg (ref 26–34)
MCHC: 33 g/dL (ref 32.0–36.0)
MCV: 87 FL (ref 80–100)
MPV: 11 FL (ref 7–11)
PLATELET COUNT: 193 K/UL (ref 150–400)
RDW: 16 % — ABNORMAL HIGH (ref 11–15)
WBC COUNT: 9.8 K/UL (ref 4.5–11.0)

## 2022-07-20 LAB — URINALYSIS DIPSTICK POC
UR. BILIRUBIN POC: NEGATIVE
UR. GLUCOSE POC: NEGATIVE
UR. KETONE POC: NEGATIVE
UR. LEUKOCYTES POC: NEGATIVE
UR. NITRITE POC: NEGATIVE
UR. PH POC: 6 (ref 5.0–8.0)
UR. PROTEIN POC: NEGATIVE
UR. SPECIFIC GRAVITY POC: 1 (ref 1.005–1.030)

## 2022-07-20 LAB — CHLAM/NG PCR SWAB
CHLAMYDIA PROBE: NEGATIVE
GONORRHEA PROBE: NEGATIVE

## 2022-07-20 LAB — SYPHILIS AB SCREEN: SYPHILIS AB, TOTAL: NEGATIVE M/UL — ABNORMAL LOW (ref 4.0–5.0)

## 2022-07-20 MED ORDER — NALOXONE 0.4 MG/ML IJ SOLN
.08 mg | INTRAVENOUS | 0 refills | Status: AC | PRN
Start: 2022-07-20 — End: ?

## 2022-07-20 MED ORDER — LIDOCAINE-EPINEPHRINE (PF) 1.5 %-1:200,000 IJ SOLN (OR)
0 refills | Status: CP
Start: 2022-07-20 — End: ?

## 2022-07-20 MED ORDER — LACTATED RINGERS IV SOLP
INTRAVENOUS | 0 refills
Start: 2022-07-20 — End: ?

## 2022-07-20 MED ORDER — MISOPROSTOL 200 MCG PO TAB
800 ug | Freq: Once | RECTAL | 0 refills
Start: 2022-07-20 — End: ?

## 2022-07-20 MED ORDER — OXYTOCIN IN 0.9 % SOD CHLORIDE 30 UNIT/500 ML IV SOLN
24 [IU] | Freq: Once | INTRAVENOUS | 0 refills
Start: 2022-07-20 — End: ?

## 2022-07-20 MED ORDER — LACTATED RINGERS IV SOLP
INTRAVENOUS | 0 refills | Status: AC
Start: 2022-07-20 — End: ?
  Administered 2022-07-21 (×2): 1000.000 mL via INTRAVENOUS

## 2022-07-20 MED ORDER — OXYTOCIN IN 0.9 % SOD CHLORIDE 30 UNIT/500 ML IV SOLN
.5-20 m[IU]/min | INTRAVENOUS | 0 refills
Start: 2022-07-20 — End: ?

## 2022-07-20 MED ORDER — FENTANYL/BUPIVACAINE 2 MCG/ML-0.125% PCA EPIDURAL SYRINGE
EPIDURAL | 0 refills | Status: AC
Start: 2022-07-20 — End: ?
  Administered 2022-07-21 (×5): 50.000 mL via EPIDURAL

## 2022-07-20 MED ORDER — LIDOCAINE (PF) 10 MG/ML (1 %) IJ SOLN
INTRAMUSCULAR | 0 refills | Status: CP
Start: 2022-07-20 — End: ?

## 2022-07-20 MED ORDER — SODIUM CITRATE-CITRIC ACID 500-334 MG/5 ML PO SOLN
30 mL | Freq: Once | ORAL | 0 refills | PRN
Start: 2022-07-20 — End: ?

## 2022-07-20 MED ADMIN — LACTATED RINGERS IV SOLP [4318]: 1000 mL | INTRAVENOUS | @ 19:00:00 | Stop: 2022-07-20 | NDC 00338011704

## 2022-07-20 NOTE — H&P (View-Only)
LABOR AND DELIVERY HISTORY AND PHYSICAL NOTE      Date of Service:  07/20/2022    ASSESSMENT  This patient is a 21 y.o. G1P0 at [redacted]w[redacted]d by L=17 who presents for contractions and labor augmentation, post-dates pregnancy      Anemia- taking oral iron   Transfer of care from Meyer Cory  History of chlamydia this pregnancy, TOC negative.   Teen pregnancy- declines SWC  UTI this pregnancy, TOC negative    PLAN    Contractions, rule out labor  -SVE: 5/50/-2  -wet prep and GCCT collected, results pending  -patient is in latent labor, postdates pregnancy      Admit to labor and delivery  Consented for induction of labor, spontaneous vaginal delivery, possible operative vaginal delivery, possible Cesarean delivery. This procedure has been fully reviewed with the patient, and written informed consent has been obtained. Risks reviewed including bleeding, infection, allergic reaction, adverse reaction/effects, injury to mom and baby, injury to surrounding organs, nerves, blood vessel, need for blood transfusion, risk of hysterectomy, need for further indicated procedures.    Anesthesia consult  Admit CBC, type & crossmatch  IVF, CLD  CEFM/TOCO  Serial SVE's   Augmentation with pitocin, AROM   GBS prophylaxis: No treatment indicated - GBS negative   Anticipate vaginal delivery   Birth control plan: declines   Hemorrhage risk: Low risk    Indications for delivery under 39 weeks: N/A    Most recent growth ultrasound  GS at [redacted]w[redacted]d- EFW 27%. AC 31%, AFI wnl, anterior placenta. Repeat ultrasound as clinically indicated.     Dispo: admit to [facility]  Fetal status: Reassuring on tracing    Future Appointments   Date Time Provider Department Center   07/21/2022  7:00 AM OBGYN [facility] 4 [facility]   07/21/2022 10:00 AM Cicchetto, Evalee Mutton, APRN [facility]   08/28/2022  1:00 PM Cleon Dew, APRN [facility]       Plan discussed with Dr. Starleen Arms   Electronically signed by: Concepcion Elk, MD, 07/20/2022 2:55 PM  _______________________________________________________________________________________________________________________    HISTORY     Chief Complaint: contractions    History of Present Illness  Patient is a 21 y.o. G1P0 at [redacted]w[redacted]d who presents for contractions since last night. Feels them across her lower abdomen, says that they are 7/10 pain and occurring every 5 minutes. Denies LOF. Says that she is having some bloody/brown discharge. Endorses good fetal movement. Does not want anything for birth control immediately after delivery.      Dating Summary  EDD 07/13/2022, by Last Menstrual Period c/w 17 week [place], currently [redacted]w[redacted]d     Antenatal care provider: Luther Parody --> CNM     Immunization History   Administered Date(s) Administered   ? COVID-19 (PFIZER), mRNA vacc, 30 mcg/0.3 mL (PF) 03/14/2020, 04/04/2020   ? Flu Vaccine =>6 Months Quadrivalent PF 05/29/2022   ? Tdap Vaccine 04/16/2022        OB History   OB History   Gravida Para Term Preterm AB Living   1             SAB IAB Ectopic Multiple Live Births                  # Outcome Date GA Lbr Len/2nd Weight Sex Delivery Anes PTL Lv   1 Current                Past Medical History:  Medical History:   Diagnosis  Date   ? Chlamydia        Past Surgical History   No past surgical history on file.    Family History   Patient's: No family history on file.    Social History   She  reports that she has never smoked. She has never used smokeless tobacco. She reports that she does not currently use alcohol. She reports that she does not currently use drugs after having used the following drugs: Marijuana.    Home Medications:  Prior to Admission medications    Medication Sig Start Date End Date Taking? Authorizing Provider   prenatal vit no.124/iron/folic (PRENATAL VITAMIN PO) Take  by mouth.    HISTORICAL PROVIDER       Allergies:  No Known Allergies    OBJECTIVE     Patient Vitals for the past 24 hrs:   Temp   07/20/22 1212 36.9 ?C (98.4 ?F)       There is no height or weight on file to calculate BMI.    Physical Exam:  General: no acute distress  Cardiovascular: regular rate  Respiratory: unlabored breathing  Abdomen: soft, gravid, non-tender to palpation  Extremities: trace edema bilaterally  SSE: normal vulva, normal vagina, scant brown discharge in the vaginal vault, cervix visually dilated   SVE: 5/50/-2  BSUS: cephalic     FHR Tracing: 120, moderate variability, positive accelerations, no decelerations  Toco: 1-2 in 10 min      Last 24 hour labs:  Recent Results (from the past 24 hour(s))   URINALYSIS DIPSTICK POC    Collection Time: 07/20/22 12:04 PM   Result Value Ref Range    Urine Color POC YELLOW     Urine Turbidity POC CLOUDY (A) CLEAR-CLEAR    Urine Specific Gravity POC 1.010 1.005 - 1.030    Urine PH POC 6.0 5.0 - 8.0    Urine Protein POC NEG NEG-NEG    Urine Glucose POC NEG NEG-NEG    Urine Ketone POC NEG NEG-NEG    Urine Bilirubin POC NEG NEG-NEG    Urine Blood POC 2+ (A) NEG-NEG    Urine Urobilinogen POC NORMAL NORM-NORMAL    Urine Nitrite POC NEG NEG-NEG    Urine Leukocytes POC NEG NEG-NEG   CBC    Collection Time: 07/20/22 12:30 PM   Result Value Ref Range    White Blood Cells 9.8 4.5 - 11.0 K/UL    RBC 3.63 (L) 4.0 - 5.0 M/UL    Hemoglobin 10.8 (L) 12.0 - 15.0 GM/DL    Hematocrit 42.5 (L) 36 - 45 %    MCV 87.9 80 - 100 FL    MCH 29.7 26 - 34 PG    MCHC 33.8 32.0 - 36.0 G/DL    RDW 95.6 (H) 11 - 15 %    Platelet Count 193 150 - 400 K/UL    MPV 11.0 7 - 11 FL   SYPHILIS AB SCREEN    Collection Time: 07/20/22 12:30 PM   Result Value Ref Range    Syphilis AB, Total NEG NEG-NEG   TYPE & CROSSMATCH    Collection Time: 07/20/22 12:30 PM   Result Value Ref Range    Units Ordered 0     Crossmatch Expires 07/23/2022,2359     Record Check 2ND TYPE REQUIRED     ABO/RH(D) B POS     Antibody Screen NEG         Lab Results   Component Value Date  ABORHD B POS 07/20/2022    ABSCRN NEG 07/20/2022    HIV12AGABSCN NONREACTIVE 04/27/2022    SYPHILISABTO NEG 07/20/2022 CHLAMYDIA NEG 07/01/2022    GONORRHEA NEG 07/01/2022      Culture-Strep Screen:   Results for orders placed or performed in visit on 06/17/22 (from the past 6570.01 hour(s))   CULTURE-STREP SCREEN    Specimen: Rectal Vaginal; Swab   Result Value Ref Range    Battery Name STREP SCREEN CULTURE     Report Status FINAL 06/19/2022     Specimen Description SWAB RECTAL/VAGINAL     Special Requests No special requests     Culture NEGATIVE FOR GROUP B STREPTOCOCCUS

## 2022-07-20 NOTE — Progress Notes
OB Intrapartum rounds    Cervical exam: 5/90/-2  Labor induction/augmentation:   None at this time   AROM when patient desires    FHR Tracing: 125/mod variability/pos accels/no decels  Toco: 2-3 in 10 minutes  Category: Category I    Management for category II or III tracing:  N/A    Assessment of labor course: Appropriate    Vitals:    07/20/22 1210 07/20/22 1212 07/20/22 1216 07/20/22 1301   BP: 126/79  126/82 131/57   Temp:  36.9 C (98.4 F)         21 y.o. G1P0 at [redacted]w[redacted]d admitted for Labor augmentation  Anemia- taking oral iron   Transfer of care from Meyer Cory  History of chlamydia this pregnancy, TOC negative.   Teen pregnancy- declines SWC  UTI this pregnancy, TOC negative    Medical complications in labor:     Plan discussed with Dr. Annell Greening    Electronically signed by: Lurlean Nanny, MD, 07/20/2022 5:04 PM

## 2022-07-21 ENCOUNTER — Encounter: Admit: 2022-07-21 | Discharge: 2022-07-21 | Payer: BC Managed Care – PPO

## 2022-07-21 DIAGNOSIS — A749 Chlamydial infection, unspecified: Secondary | ICD-10-CM

## 2022-07-21 MED ADMIN — MISOPROSTOL 200 MCG PO TAB [10629]: 800 ug | RECTAL | @ 17:00:00 | Stop: 2022-07-21 | NDC 70954044410

## 2022-07-21 MED ADMIN — LACTATED RINGERS IV SOLP [4318]: 1000 mL | INTRAVENOUS | @ 04:00:00 | Stop: 2022-07-21 | NDC 00338011704

## 2022-07-21 MED ADMIN — OXYTOCIN IN 0.9 % SOD CHLORIDE 30 UNIT/500 ML IV SOLN [94021]: 2 m[IU]/min | INTRAVENOUS | @ 07:00:00 | Stop: 2022-07-21 | NDC 70092106807

## 2022-07-21 MED ADMIN — ACETAMINOPHEN 325 MG PO TAB [101]: 650 mg | ORAL | @ 18:00:00 | Stop: 2022-08-04 | NDC 00904677361

## 2022-07-21 MED ADMIN — IBUPROFEN 600 MG PO TAB [3844]: 600 mg | ORAL | @ 18:00:00 | Stop: 2022-08-04 | NDC 00904585461

## 2022-07-21 NOTE — Progress Notes
OB Intrapartum rounds    Cervical exam: 6/100/-1  Labor induction/augmentation:   None at this time   AROM this check  No pitocin at this time, will consider starting if goes unchanged    FHR Tracing: 135/mod variability/pos accels/no decels  Toco: 3-4 in 10 minutes  Category: Category I    Management for category II or III tracing:  N/A    Assessment of labor course: Appropriate    Vitals:    07/20/22 2340 07/20/22 2341 07/20/22 2345 07/20/22 2346   BP:  119/54  109/79   Pulse: 95 81 88 101   Temp:       SpO2: 100%  100%    O2 Device:           21 y.o. G1P0 at 109w0d admitted for Labor augmentation  Anemia- taking oral iron   Transfer of care from Meyer Cory  History of chlamydia this pregnancy, TOC negative.   Teen pregnancy- declines SWC  UTI this pregnancy, TOC negative    Medical complications in labor:     Plan discussed with Dr. Bennetta Laos    Electronically signed by: Maryjane Hurter, DO, 07/20/2022 11:50 PM

## 2022-07-21 NOTE — Anesthesia Pre-Procedure Evaluation
Anesthesia Pre-Procedure Evaluation    Name: Alison Miles      MRN: [id number]     DOB: 2001/07/03     Age: 21 y.o.     Sex: female   _________________________________________________________________________     Procedure Info:   Procedure Information    Date: 07/20/22  Procedure: ANESTHESIA LABOR EPIDURAL         Physical Assessment  Vital Signs (last filed in past 24 hours):  BP: 116/77 (12/04 2201)  Temp: 36.9 ?C (98.4 ?F) (12/04 1945)  Pulse: 81 (12/04 2230)  Respirations: 16 PER MINUTE (12/04 1945)  SpO2: 99 % (12/04 2230)  O2 Device: None (Room air) (12/04 1945)      Patient History   No Known Allergies     Current Medications    Medication Directions   prenatal vit no.124/iron/folic (PRENATAL VITAMIN PO) Take  by mouth.       Review of Systems/Medical History      Patient summary reviewed  Nursing notes reviewed  Pertinent labs reviewed    PONV Screening: Non-smoker and Female sex  No history of anesthetic complications  No family history of anesthetic complications        Pulmonary      Not a current smoker        No indications/hx of asthma    no COPD      No shortness of breath      No Obstructive Sleep Apnea      Cardiovascular         Exercise tolerance: >4 METS      Beta Blocker therapy: No      Beta blockers within 24 hours: n/a      No hypertension,     No valvular problems/murmurs                  GI/Hepatic/Renal           No GERD,         No liver disease:      No renal disease:          Neuro/Psych       No seizures      No hx neuromuscular disease      No hx TIA      No CVA      No indications/hx of sensory deficit      Musculoskeletal         No neck pain      No back pain        Endocrine/Other       No diabetes        No hypothyroidism      No hyperthyroidism      Anemia          Pregnant; GA:[redacted]w[redacted]d, GP:G1P0                             Physical Exam    Airway Findings      Mallampati: II      TM distance: >3 FB      Neck ROM: full      Mouth opening: good    Dental Findings: Negative      Cardiovascular Findings:       Rhythm: regular      Rate: normal    Pulmonary Findings:       Breath sounds clear to auscultation.    Abdominal Findings:  Gravid    Neurological Findings:       Alert and oriented x 3    Constitutional findings:       No acute distress      Well-developed      Well-nourished       Previous Airway Procedure Notes Displaying the 3 most recent records   No records found.         Patient Lines/Drains/Airways Status     Active Lines:     Name Placement date Placement time Site Days    Peripheral IV 07/20/22 1200 Left Anterior Forearm 18 G 07/20/22  1200  -- less than 1              Diagnostic Tests  Hematology:   Lab Results   Component Value Date    HGB 10.8 07/20/2022    HCT 31.9 07/20/2022    PLTCT 193 07/20/2022    WBC 9.8 07/20/2022    MCV 87.9 07/20/2022    MCH 29.7 07/20/2022    MCHC 33.8 07/20/2022    MPV 11.0 07/20/2022    RDW 16.5 07/20/2022         General Chemistry: No results found for: NA, K, CL, CO2, GAP, BUN, CR, GLU, CA, KETONES, ALBUMIN, LACTIC, OBSCA, MG, TOTBILI, TOTBILCB, PO4   Coagulation: No results found for: PT, PTT, INR    PAC Plan      Anesthesia Plan    ASA score: 2   Plan: epidural  NPO status: full stomach      Informed Consent  Anesthetic plan and risks discussed with patient.  Use of blood products discussed with patient  Blood Consent: consented      Plan discussed with: anesthesiologist and resident.      Alerts

## 2022-07-21 NOTE — Care Plan
Problem: Discharge Planning  Goal: Participation in plan of care  Outcome: Goal Ongoing  Goal: Knowledge regarding plan of care  Outcome: Goal Ongoing  Goal: Prepared for discharge  Outcome: Goal Ongoing     Problem: Anxiety  Goal: Alleviation of anxiety  Outcome: Goal Ongoing     Problem: Pain  Goal: Management of pain  Outcome: Goal Ongoing  Goal: Knowledge of pain management  Outcome: Goal Ongoing     Problem: Breastfeeding  Goal: Effective breast-feeding  Outcome: Goal Ongoing  Goal: Knowledge of breast-feeding  Outcome: Goal Ongoing     Problem: Labor  Goal: Labor duration, first stage, within specified parameters  Outcome: Goal Ongoing  Goal: Labor duration, second stage, within specified parameters  Outcome: Goal Ongoing  Goal: Uterine contraction within specified parameters  Outcome: Goal Ongoing     Problem: Maternal Injury, Risk of  Goal: Absence of physical injury  Outcome: Goal Ongoing  Goal: Absence of hemorrhage signs and symptoms  Outcome: Goal Ongoing     Problem: Parent-Infant Attachment, Risk of, Impaired  Goal: Knowledge of parental-infant bonding  Outcome: Goal Ongoing     Problem: Skin Integrity  Goal: Skin integrity intact  Outcome: Goal Ongoing  Goal: Healing of skin (Wound & Incision)  Outcome: Goal Ongoing     Problem: OB Oxytocin Labor Induction/Augmentation  Goal: Management of Uterine Contractions  Outcome: Goal Ongoing  Goal: Monitor/Support Fetal and Maternal Well Being  Outcome: Goal Ongoing     Problem: Magnesium Sulfate Infusion  Goal: Decrease Risk of Injury  Outcome: Goal Ongoing

## 2022-07-22 ENCOUNTER — Inpatient Hospital Stay: Admit: 2022-07-22 | Discharge: 2022-07-22 | Payer: BC Managed Care – PPO

## 2022-07-22 MED ADMIN — ACETAMINOPHEN 325 MG PO TAB [101]: 650 mg | ORAL | @ 16:00:00 | Stop: 2022-08-04 | NDC 00904677361

## 2022-07-22 MED ADMIN — FAMOTIDINE 20 MG PO TAB [10011]: 20 mg | ORAL | @ 16:00:00 | NDC 63739064510

## 2022-07-22 MED ADMIN — IBUPROFEN 600 MG PO TAB [3844]: 600 mg | ORAL | Stop: 2022-08-04 | NDC 00904585461

## 2022-07-22 MED ADMIN — IRON SUCROSE 100 MG IRON/5 ML IV SOLN GROUP [280019]: 300 mg | INTRAVENOUS | @ 21:00:00 | Stop: 2022-07-25 | NDC 00517234001

## 2022-07-22 MED ADMIN — IBUPROFEN 600 MG PO TAB [3844]: 600 mg | ORAL | @ 07:00:00 | Stop: 2022-08-04 | NDC 00904585461

## 2022-07-22 MED ADMIN — PRENATAL VIT-IRON FUM-FOLIC AC 28 MG IRON- 800 MCG PO TAB [80639]: 1 | ORAL | @ 16:00:00 | NDC 00536406301

## 2022-07-22 MED ADMIN — SODIUM CHLORIDE 0.9 % IV SOLP [27838]: 300 mg | INTRAVENOUS | @ 21:00:00 | Stop: 2022-07-25 | NDC 00338004902

## 2022-07-22 MED ADMIN — DOCUSATE SODIUM 100 MG PO CAP [2566]: 100 mg | ORAL | @ 16:00:00 | NDC 00904718361

## 2022-07-22 MED ADMIN — ACETAMINOPHEN 325 MG PO TAB [101]: 650 mg | ORAL | @ 21:00:00 | Stop: 2022-08-04 | NDC 00904677361

## 2022-07-22 MED ADMIN — ACETAMINOPHEN 325 MG PO TAB [101]: 650 mg | ORAL | Stop: 2022-08-04 | NDC 00904677361

## 2022-07-22 MED ADMIN — IBUPROFEN 600 MG PO TAB [3844]: 600 mg | ORAL | @ 13:00:00 | Stop: 2022-08-04 | NDC 00904585461

## 2022-07-22 MED ADMIN — IBUPROFEN 600 MG PO TAB [3844]: 600 mg | ORAL | @ 21:00:00 | Stop: 2022-08-04 | NDC 00904585461

## 2022-07-23 ENCOUNTER — Encounter: Admit: 2022-07-23 | Discharge: 2022-07-23 | Payer: BC Managed Care – PPO

## 2022-07-23 MED ADMIN — IRON SUCROSE 100 MG IRON/5 ML IV SOLN GROUP [280019]: 300 mg | INTRAVENOUS | @ 15:00:00 | Stop: 2022-07-23 | NDC 00517234010

## 2022-07-23 MED ADMIN — IBUPROFEN 600 MG PO TAB [3844]: 600 mg | ORAL | @ 14:00:00 | Stop: 2022-07-23 | NDC 00904585461

## 2022-07-23 MED ADMIN — ACETAMINOPHEN 325 MG PO TAB [101]: 650 mg | ORAL | @ 14:00:00 | Stop: 2022-07-23 | NDC 00904677361

## 2022-07-23 MED ADMIN — DOCUSATE SODIUM 100 MG PO CAP [2566]: 100 mg | ORAL | @ 03:00:00 | NDC 00904718361

## 2022-07-23 MED ADMIN — ACETAMINOPHEN 325 MG PO TAB [101]: 650 mg | ORAL | @ 03:00:00 | Stop: 2022-08-04 | NDC 00904677361

## 2022-07-23 MED ADMIN — PRENATAL VIT-IRON FUM-FOLIC AC 28 MG IRON- 800 MCG PO TAB [80639]: 1 | ORAL | @ 14:00:00 | Stop: 2022-07-23 | NDC 00536406301

## 2022-07-23 MED ADMIN — IBUPROFEN 600 MG PO TAB [3844]: 600 mg | ORAL | @ 03:00:00 | Stop: 2022-08-04 | NDC 00904585461

## 2022-07-23 MED ADMIN — ACETAMINOPHEN 325 MG PO TAB [101]: 650 mg | ORAL | @ 07:00:00 | Stop: 2022-07-23 | NDC 00904677361

## 2022-07-23 MED ADMIN — FAMOTIDINE 20 MG PO TAB [10011]: 20 mg | ORAL | @ 03:00:00 | NDC 63739064510

## 2022-07-23 MED ADMIN — SODIUM CHLORIDE 0.9 % IV SOLP [27838]: 300 mg | INTRAVENOUS | @ 15:00:00 | Stop: 2022-07-23 | NDC 00338004902

## 2022-07-23 MED ADMIN — DOCUSATE SODIUM 100 MG PO CAP [2566]: 100 mg | ORAL | @ 14:00:00 | Stop: 2022-07-23 | NDC 00904718361

## 2022-07-23 MED ADMIN — FAMOTIDINE 20 MG PO TAB [10011]: 20 mg | ORAL | @ 14:00:00 | Stop: 2022-07-23 | NDC 63739064510

## 2022-07-23 MED FILL — IBUPROFEN 600 MG PO TAB: 600 mg | ORAL | 8 days supply | Qty: 30 | Fill #1 | Status: CP

## 2022-07-23 MED FILL — PRENATAL VIT-IRON FUM-FOLIC AC 28 MG IRON- 800 MCG PO TAB: 28 mg iron- 800 mcg | ORAL | 90 days supply | Qty: 90 | Fill #1 | Status: CP

## 2022-08-04 ENCOUNTER — Encounter: Admit: 2022-08-04 | Discharge: 2022-08-04 | Payer: BC Managed Care – PPO

## 2022-08-04 NOTE — Telephone Encounter
Called to f/u with patient during the postpartum period.   Patient is two weeks postpartum, following a SVD at [redacted]w[redacted]d on 07/20/22.   Postpartum course has been uncomplicated and patient is feeling well.   Denies s/s of infection, depression, SOB, or chest pains.        Care Team OB provider: [facility]  Pediatrician: [facility]  PCP:   Specialists:     PPV Scheduled   08/28/22         Infant Feeding Plan   Both   Reproductive life plan/Contraception   Contraception:Declined     Delivery Type   Vaginal     Pregnancy Complications     None   Postpartum Problems   None       Chronic Illness   None     Mental Health EPDS Performed: Yes       Coping well and mood is appropriate.   Denies increased anxiety, depression, suicidal or homicidal ideation.   Recommendation: Repeat screening at 6wk PPV        Thanked patient for taking the time to talk to me and answer a few questions. Advised to contact her nurse team if she has any other questions or concerns.   Patient verbalized understanding and appreciated this call. Routed to Dr. Franky Macho  for notification and f/u.

## 2022-08-18 NOTE — Progress Notes
Postpartum Visit    Subjective:     Alison Miles is a 22 y.o.. G1P1001    She presents 5 weeks postpartum following a spontaneous vaginal delivery at [redacted]w[redacted]d on 07/20/22 .   Feeling good overall. Not having any physical pain and feels like she is coping well. Does feel like she has a vaginal odor that she has to wear pads to hide. Is open to a oneswab today. Desires birth control would like POPs.       Anesthesia: Epidural.    Labor: augmented    Delivery complications: None    Postpartum course has been uncomplicated.     Baby is feeding  pumped breast milk .    Bleeding no bleeding.     Patient is sexually active but has not resumed intercourse PP  Dyspareunia: n/a  Urinary/fecal incontinence: none  Postpartum depression screening: Edinburgh score 0 .  Baby girl doing well.      Prenatal care provider: CNM  Delivery physician:  Cleon Dew  Antepartum complications:  Chlamydia, Anemia, Teen pregnancy, UTI    Birth spacing/reproductive life plans: declines birth control     Gynecology history:  Last Pap:  patient has never had a pap test  History of abnormal paps:  n/a  STD history: past history: Chlaydia  Social History     Substance and Sexual Activity   Sexual Activity Yes    Partners: Male       Immunizations:   Gardasil n ot received 1st dose given 08/28/22 scheduled for follow up doses   COVID UTD  Flu vaccine UTD    Review of Systems   Constitutional: Negative for fever, fatigue and unexpected weight change.   HENT: Negative for voice change.   Respiratory: Negative for cough and shortness of breath.   Cardiovascular: Negative for chest pain and leg swelling.   Gastrointestinal: Negative for nausea, vomiting, abdominal pain, diarrhea, constipation and blood in stool.   Genitourinary: Negative for dysuria, urgency, frequency, hematuria, vaginal bleeding, + vaginal discharge, enuresis, difficulty urinating, genital sores, vaginal pain, menstrual problem, pelvic pain and dyspareunia.   Musculoskeletal: Negative for back pain and arthralgias.   Skin: Negative for rash.   Neurological: Negative for light-headedness and headaches.   Hematological: Negative for adenopathy. Does not bruise/bleed easily.   Psychiatric/Behavioral: Negative for confusion. The patient is not nervous/anxious.     Medical History:   Diagnosis Date    Chlamydia            Allergies  Patient has no known allergies.     Medications  Current Outpatient Medications   Medication Sig Dispense Refill    acetaminophen (TYLENOL) 325 mg tablet Take two tablets by mouth every 4 hours as needed. 90 tablet 1    docusate (COLACE) 100 mg capsule Take one capsule by mouth twice daily. 180 capsule 1    ibuprofen (MOTRIN) 600 mg tablet Take one tablet by mouth every 6 hours. Take with food. 30 tablet 3    lanolin (LAN-O-SOOTHE) topical cream Apply  topically to affected area as Needed. 56 g 3    prenatal vit no.124/iron/folic (PRENATAL VITAMIN PO) Take  by mouth.      prenatal vit-iron fum-folic ac 28 mg iron- 800 mcg tablet Take one tablet by mouth daily. 90 tablet 3     Current Facility-Administered Medications   Medication Dose Route Frequency Provider Last Rate Last Admin    human papillomav vac,9-val(PF) (GARDASIL 9) injection 0.5 mL  0.5 mL Intramuscular  ONCE [place], [place]land, APRN             Objective:     Physical Exam  BP 105/72 (BP Source: Arm, Right Upper, Patient Position: Sitting)  - Pulse 69  - Temp 36.9 ?C (98.4 ?F)  - Resp 17  - Ht 154.9 cm (5' 1)  - Wt 74.8 kg (165 lb)  - LMP 10/06/2021  - Breastfeeding Yes  - BMI 31.18 kg/m?     Gen: NAD  CV: RRR, no appreciable murmurs  Chest: Unlabored, CTAB  Abd: Soft, nontender, nondistended, Diastasis Rectus x1 finger breadth  Ext: No LE edema, nttp LE  GU: normal appearing external genitalia  SSE: periurethral repair well approximated        Assessment:          Visit diagnoses  1. 6 weeks postpartum follow-up  human papillomav vac,9-val(PF) (GARDASIL 9) injection 0.5 mL        Vaginal Discharge  -Oneswab sent    Cervical Cancer Prevention  -1st dose HPV Vaccine given today 2nd dose in 2 months 3rd dose in 6 months     Desire for birth control  Education given regarding all forms of birth control, efficacy, use, risks vs. benefits. Edcuated to begin POPs now since pt has not been sexually active since delivery. Educated to use back up form of BC for first 7 days following initiation. If a pill is missed, use back up form of birth control for 7 days. Educated to take birth control at same time everyday. Edcuated BCM does not protect against STDs.      Plan:            Contraception - POPs script sent  Pap - not done due to age   Pelvic Floor Physical Therapy- declined   Weight, nutrition, and exercise counseling provided  FU 12 months for annual well woman exam    Orders Placed This Encounter    human papillomav vac,9-val(PF) (GARDASIL 9) injection 0.5 mL       Cleon Dew, APRN, CNM  Collaborating Physician: Dr. Keane Police

## 2022-08-28 ENCOUNTER — Ambulatory Visit: Admit: 2022-08-28 | Discharge: 2022-08-29 | Payer: BC Managed Care – PPO

## 2022-08-28 ENCOUNTER — Encounter: Admit: 2022-08-28 | Discharge: 2022-08-28 | Payer: BC Managed Care – PPO

## 2022-08-28 DIAGNOSIS — A749 Chlamydial infection, unspecified: Secondary | ICD-10-CM

## 2022-08-28 MED ORDER — HUMAN PAPILLOMAV VAC,9-VAL(PF) 0.5 ML IM SYRG
0.5 mL | Freq: Once | INTRAMUSCULAR | 0 refills | Status: CP
Start: 2022-08-28 — End: ?
  Administered 2022-08-28: 21:00:00 0.5 mL via INTRAMUSCULAR

## 2022-08-28 MED ORDER — NORETHINDRONE (CONTRACEPTIVE) 0.35 MG PO TAB
.35 mg | ORAL_TABLET | Freq: Every day | ORAL | 1 refills | 84.00000 days | Status: AC
Start: 2022-08-28 — End: ?

## 2022-09-01 ENCOUNTER — Encounter: Admit: 2022-09-01 | Discharge: 2022-09-01 | Payer: BC Managed Care – PPO

## 2022-09-01 DIAGNOSIS — N760 Acute vaginitis: Secondary | ICD-10-CM

## 2022-09-01 MED ORDER — METRONIDAZOLE 500 MG PO TAB
500 mg | ORAL_TABLET | Freq: Two times a day (BID) | ORAL | 0 refills | Status: AC
Start: 2022-09-01 — End: ?

## 2022-09-01 NOTE — Telephone Encounter
Received fax with results from pt's One Swab. This RN did note that pt's results showed positive results for Gardnerella vaginalis, Atopobium vaginae, and Type 1 Megasphaera Species only. All other results noted as negative. Will have results sheet scanned into pt's chart. Routing to pt's providers for further review and recommendations if need be.

## 2022-09-08 ENCOUNTER — Encounter: Admit: 2022-09-08 | Discharge: 2022-09-08 | Payer: BC Managed Care – PPO

## 2022-10-27 ENCOUNTER — Encounter: Admit: 2022-10-27 | Discharge: 2022-10-27 | Payer: BC Managed Care – PPO

## 2022-10-27 ENCOUNTER — Ambulatory Visit: Admit: 2022-10-27 | Discharge: 2022-10-28 | Payer: BC Managed Care – PPO

## 2022-10-27 MED ORDER — HUMAN PAPILLOMAV VAC,9-VAL(PF) 0.5 ML IM SYRG
0.5 mL | Freq: Once | INTRAMUSCULAR | 0 refills | Status: CP
Start: 2022-10-27 — End: ?
  Administered 2022-10-27: 14:00:00 0.5 mL via INTRAMUSCULAR

## 2022-10-27 NOTE — Progress Notes
Patient presented to clinic for second Gardasil shot. Gardasil administered to right deltoid. Patient tolerated, no adverse reactions noted. Last injection scheduled.     Future Appointments   Date Time Provider [place]   01/28/2023  9:00 AM [facility]

## 2023-01-28 ENCOUNTER — Encounter: Admit: 2023-01-28 | Discharge: 2023-01-28 | Payer: BC Managed Care – PPO

## 2023-01-28 NOTE — Telephone Encounter
Called pt. Pt agreeable to move appt to July per CDC guidelines.     Patient verbalized understanding. No further questions or concerns at this time.    Future Appointments   Date Time Provider Department Center   02/15/2023  3:00 PM [facility] NURSE [facility] OB/GYN

## 2023-01-28 NOTE — Telephone Encounter
Called pt and LVM stating that her third HPV should be given 6 months after her first one which was given 08/28/22. Informed pt that 3rd HPV should given in July.

## 2023-02-15 ENCOUNTER — Encounter: Admit: 2023-02-15 | Discharge: 2023-02-15 | Payer: BC Managed Care – PPO

## 2023-02-15 ENCOUNTER — Ambulatory Visit: Admit: 2023-02-15 | Discharge: 2023-02-15 | Payer: BC Managed Care – PPO

## 2023-02-15 MED ORDER — HUMAN PAPILLOMAV VAC,9-VAL(PF) 0.5 ML IM SYRG
0.5 mL | Freq: Once | INTRAMUSCULAR | 0 refills | Status: CP
Start: 2023-02-15 — End: ?
  Administered 2023-02-15: 21:00:00 0.5 mL via INTRAMUSCULAR

## 2023-02-15 NOTE — Progress Notes
Patient ambulated to clinic for 3rd gardasil.  Consent obtained. Vaccine administered in left deltoid.  Patient denied questions or concerns and ambulated out of clinic.

## 2024-06-29 NOTE — Progress Notes [1]
 [facility] - Well Woman      Subjective:     History of present illness - Alison Miles is a 23 y.o. G1P1001 here today for Well Woman  . No concerns today. Desires routine STI screening.       OB History   Gravida Para Term Preterm AB Living   1 1 1  0 0 1   SAB IAB Ectopic Multiple Live Births   0 0 0 0 1       Gynecology history:  LMP:  Patient's last menstrual period was 07/04/2024 (exact date).  Menstrual History    Age at menarche 64     Duration of menses 6     Length of Cycle 28     Menopause?      Age at menopause       Last Pap:  patient has never had a pap test. Collected today.   History of abnormal paps:  n/a  STD history: chlamydia   S/p gardasil series   Social History     Substance and Sexual Activity   Sexual Activity Yes    Partners: Male    Birth control/protection: Condom, None       Review of Systems   Constitutional: Negative for fever, fatigue and unexpected weight change.   HENT: Negative for voice change.   Respiratory: Negative for cough and shortness of breath.   Cardiovascular: Negative for chest pain and leg swelling.   Gastrointestinal: Negative for nausea, vomiting, abdominal pain, diarrhea, constipation and blood in stool.   Genitourinary: Negative for dysuria, urgency, frequency, hematuria,vaginal discharge, enuresis, difficulty urinating, genital sores, vaginal pain, menstrual problem, pelvic pain and dyspareunia. Currently on day 1 of period.   Musculoskeletal: Negative for back pain and arthralgias.   Skin: Negative for rash.   Neurological: Negative for light-headedness and headaches.   Hematological: Negative for adenopathy. Does not bruise/bleed easily.   Psychiatric/Behavioral: Negative for confusion. The patient is not nervous/anxious.       Past Medical History:    Chlamydia    Sexually transmitted disease       No past surgical history on file.    Family History   Problem Relation Name Age of Onset    Diabetes Maternal Grandmother      Diabetes Maternal Grandfather Social History     Tobacco Use    Smoking status: Never     Passive exposure: Never    Smokeless tobacco: Never   Vaping Use    Vaping status: Never Used   Substance Use Topics    Alcohol use: Not Currently    Drug use: Not Currently     Types: Marijuana       Allergies  Patient has no known allergies.     Medications   acetaminophen  (TYLENOL ) 325 mg tablet Take two tablets by mouth every 4 hours as needed.    docusate (COLACE) 100 mg capsule Take one capsule by mouth twice daily.    ibuprofen  (MOTRIN ) 600 mg tablet Take one tablet by mouth every 6 hours. Take with food.    lanolin (LAN-O-SOOTHE) topical cream Apply  topically to affected area as Needed.    norethindrone  (contraceptive) (ORTHO MICRONOR ) 0.35 mg tablet Take one tablet by mouth daily. (Patient not taking: Reported on 07/04/2024)    prenatal vit no.124/iron /folic (PRENATAL VITAMIN PO) Take  by mouth. (Patient not taking: Reported on 07/04/2024)    prenatal vit-iron  fum-folic ac 28 mg iron - 800 mcg tablet Take one tablet by mouth  daily. (Patient not taking: Reported on 07/04/2024)         Objective:     Physical Exam  BP 117/73  - Pulse 82  - Temp 36.6 ?C (97.9 ?F) (Oral)  - Ht 154.9 cm (5' 1)  - Wt 64.5 kg (142 lb 3.2 oz)  - LMP 07/04/2024 (Exact Date)  - Breastfeeding No  - BMI 26.87 kg/m?     General:     NAD   HEENT:  Deferred   Thyroid:  normal to inspection and palpation   Lymph Nodes:  Cervical, supraclavicular, and axillary nodes normal.   Breasts:  self-exam is taught and encouraged   Lungs:  Effort normal.    Heart:  Normal rate and intact distal pulses.     Abdomen:  Soft. She exhibits no distension and no mass. There is no tenderness. There is no rebound and no guarding.    Pysch:  oriented, normal mood   Vulva:  Normal Female Genitalia, No Lesions   GU:  normal   Vagina:  Normal mucosa, scant blood   Cervix:  no lesions, multiparous appearance, no cervical motion tenderness   Uterus:  Normal shape, position and consistency   Left Adnexa:  Normal   Right Adnexa:  Normal   Rectovaginal:  Deferred        Assessment:     1. Screening for cervical cancer  CYTOLOGY PAP    CYTOLOGY PAP    CBC AND DIFF    COMPREHENSIVE METABOLIC PANEL    TSH WITH FREE T4 REFLEX    HEMOGLOBIN A1C    SYPHILIS AB SCREEN    HIV 1 & 2 AG-AB SCRN W REFLEX TO HIV CONFIRMATION    HEPATITIS B SURFACE AG    MISCELLANEOUS LAB TEST    CHLAM/NG PCR SWAB    TRICHOMONAS VAGINALIS SWAB      2. Screening examination for sexually transmitted disease  CHLAM/NG PCR SWAB    TRICHOMONAS VAGINALIS SWAB    CBC AND DIFF    COMPREHENSIVE METABOLIC PANEL    TSH WITH FREE T4 REFLEX    HEMOGLOBIN A1C    SYPHILIS AB SCREEN    HIV 1 & 2 AG-AB SCRN W REFLEX TO HIV CONFIRMATION    HEPATITIS B SURFACE AG    MISCELLANEOUS LAB TEST    CHLAM/NG PCR SWAB    TRICHOMONAS VAGINALIS SWAB      3. Well woman exam  CBC AND DIFF    COMPREHENSIVE METABOLIC PANEL    TSH WITH FREE T4 REFLEX    HEMOGLOBIN A1C    SYPHILIS AB SCREEN    HIV 1 & 2 AG-AB SCRN W REFLEX TO HIV CONFIRMATION    HEPATITIS B SURFACE AG    MISCELLANEOUS LAB TEST    CHLAM/NG PCR SWAB    TRICHOMONAS VAGINALIS SWAB          Plan:            Pap - performed today, discussed cervical cancer screening guidelines  Contraception - declines. Using condoms.   Counseled on regular exercise - trying to get 150 min a week  Encouraged to take PNV daily.   STI screening ordered per pt request.   Annual health maintenance labs order with exception of lipid panel (pt not fasting)  Encouraged breast self awareness.   Next appointment: 1 year or prn     Orders Placed This Encounter    Chlam/Ng PCR Swab    Trichomonas Vaginalis Swab  Chlam/Ng PCR Swab    Trichomonas Vaginalis Swab    CBC And Diff    Comprehensive Metabolic Panel    TSH With Free T4 Reflex    Hemoglobin A1C    Syphilis Ab Screen    HIV 1 & 2 Ag-Ab Scrn W Reflex To HIV Confirmation    Hepatitis B Surface Ag    Miscellaneous Lab Test    Cytology Pap    Cytology Pap Smear    HPV Hold Label Future Appointments   Date Time Provider Department Center   11/01/2024  3:30 PM Darla Ensign, MD [id number]     Rocky Lunger, APRN, CNM  Collaborating MD: Dr. Jess

## 2024-07-04 ENCOUNTER — Ambulatory Visit: Admit: 2024-07-04 | Discharge: 2024-07-05 | Payer: BLUE CROSS/BLUE SHIELD

## 2024-07-04 ENCOUNTER — Encounter: Admit: 2024-07-04 | Discharge: 2024-07-04 | Payer: BLUE CROSS/BLUE SHIELD

## 2024-07-04 ENCOUNTER — Ambulatory Visit: Admit: 2024-07-04 | Discharge: 2024-07-04 | Payer: BLUE CROSS/BLUE SHIELD

## 2024-07-04 VITALS — BP 117/73 | HR 82 | Temp 97.90000°F | Ht 61.0 in | Wt 142.2 lb

## 2024-07-04 DIAGNOSIS — Z113 Encounter for screening for infections with a predominantly sexual mode of transmission: Secondary | ICD-10-CM

## 2024-07-04 DIAGNOSIS — Z124 Encounter for screening for malignant neoplasm of cervix: Principal | ICD-10-CM

## 2024-07-04 DIAGNOSIS — Z01419 Encounter for gynecological examination (general) (routine) without abnormal findings: Secondary | ICD-10-CM

## 2024-07-05 ENCOUNTER — Encounter: Admit: 2024-07-05 | Discharge: 2024-07-05 | Payer: BLUE CROSS/BLUE SHIELD
